# Patient Record
Sex: Male | Born: 1985 | Race: White | Hispanic: No | Marital: Single | Smoking: Never smoker
Health system: Southern US, Community
[De-identification: ages and names within clinical notes are randomized; demographics above are authoritative.]

## PROBLEM LIST (undated history)

## (undated) DIAGNOSIS — K5090 Crohn's disease, unspecified, without complications: Secondary | ICD-10-CM

---
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
## 2007-01-07 ENCOUNTER — Emergency Department: Payer: Self-pay | Admitting: Emergency Medicine

## 2007-02-21 ENCOUNTER — Ambulatory Visit: Payer: Self-pay | Admitting: Gastroenterology

## 2007-09-04 ENCOUNTER — Other Ambulatory Visit: Payer: Self-pay

## 2007-09-04 ENCOUNTER — Emergency Department: Payer: Self-pay | Admitting: Emergency Medicine

## 2008-01-20 ENCOUNTER — Ambulatory Visit: Payer: Self-pay | Admitting: Internal Medicine

## 2008-01-25 ENCOUNTER — Ambulatory Visit: Payer: Self-pay | Admitting: Internal Medicine

## 2008-01-27 ENCOUNTER — Ambulatory Visit: Payer: Self-pay | Admitting: Family Medicine

## 2008-02-22 ENCOUNTER — Ambulatory Visit: Payer: Self-pay | Admitting: Internal Medicine

## 2008-08-15 ENCOUNTER — Emergency Department: Payer: Self-pay | Admitting: Emergency Medicine

## 2008-11-06 ENCOUNTER — Ambulatory Visit: Payer: Self-pay | Admitting: Internal Medicine

## 2009-10-29 ENCOUNTER — Ambulatory Visit: Payer: Self-pay | Admitting: Internal Medicine

## 2010-11-26 ENCOUNTER — Ambulatory Visit: Payer: Self-pay | Admitting: Family Medicine

## 2010-12-01 ENCOUNTER — Ambulatory Visit: Payer: Self-pay | Admitting: Internal Medicine

## 2012-09-19 ENCOUNTER — Emergency Department: Payer: Self-pay | Admitting: Emergency Medicine

## 2012-09-19 LAB — CBC WITH DIFFERENTIAL/PLATELET
Basophil #: 0.1 10*3/uL (ref 0.0–0.1)
HGB: 14.3 g/dL (ref 13.0–18.0)
Lymphocyte #: 1.4 10*3/uL (ref 1.0–3.6)
Lymphocyte %: 7.8 %
MCH: 26.6 pg (ref 26.0–34.0)
MCHC: 32.1 g/dL (ref 32.0–36.0)
Monocyte #: 1.1 x10 3/mm — ABNORMAL HIGH (ref 0.2–1.0)
Monocyte %: 6.1 %
Neutrophil %: 85.3 %
RDW: 12.6 % (ref 11.5–14.5)
WBC: 18.5 10*3/uL — ABNORMAL HIGH (ref 3.8–10.6)

## 2012-09-19 LAB — COMPREHENSIVE METABOLIC PANEL
Albumin: 4.1 g/dL (ref 3.4–5.0)
Alkaline Phosphatase: 66 U/L (ref 50–136)
Anion Gap: 10 (ref 7–16)
BUN: 18 mg/dL (ref 7–18)
Bilirubin,Total: 0.5 mg/dL (ref 0.2–1.0)
Chloride: 106 mmol/L (ref 98–107)
Creatinine: 0.97 mg/dL (ref 0.60–1.30)
EGFR (Non-African Amer.): >60
Osmolality: 284 (ref 275–301)
Potassium: 3.8 mmol/L (ref 3.5–5.1)
SGOT(AST): 187 U/L — ABNORMAL HIGH (ref 15–37)
Sodium: 141 mmol/L (ref 136–145)

## 2013-07-02 ENCOUNTER — Ambulatory Visit: Payer: Self-pay | Admitting: Internal Medicine

## 2015-02-01 ENCOUNTER — Encounter: Payer: Self-pay | Admitting: Emergency Medicine

## 2015-02-01 ENCOUNTER — Ambulatory Visit
Admission: EM | Admit: 2015-02-01 | Discharge: 2015-02-01 | Disposition: A | Discharge status: Home / Self Care | Payer: 59 | Attending: Physician Assistant | Admitting: Physician Assistant

## 2015-02-01 DIAGNOSIS — K5090 Crohn's disease, unspecified, without complications: Secondary | ICD-10-CM | POA: Diagnosis not present

## 2015-02-01 DIAGNOSIS — R252 Cramp and spasm: Secondary | ICD-10-CM | POA: Insufficient documentation

## 2015-02-01 DIAGNOSIS — M79603 Pain in arm, unspecified: Secondary | ICD-10-CM | POA: Diagnosis present

## 2015-02-01 HISTORY — DX: Crohn's disease, unspecified, without complications: K50.90

## 2015-02-01 LAB — CKMB (ARMC ONLY): CK, MB: 15 ng/mL — AB (ref 0.5–5.0)

## 2015-02-01 LAB — COMPREHENSIVE METABOLIC PANEL
ALBUMIN: 4.8 g/dL (ref 3.5–5.0)
ALT: 117 U/L — ABNORMAL HIGH (ref 17–63)
ANION GAP: 10 (ref 5–15)
AST: 95 U/L — ABNORMAL HIGH (ref 15–41)
Alkaline Phosphatase: 57 U/L (ref 38–126)
BILIRUBIN TOTAL: 0.3 mg/dL (ref 0.3–1.2)
BUN: 13 mg/dL (ref 6–20)
CO2: 29 mmol/L (ref 22–32)
CREATININE: 0.97 mg/dL (ref 0.61–1.24)
Calcium: 10.1 mg/dL (ref 8.9–10.3)
Chloride: 100 mmol/L — ABNORMAL LOW (ref 101–111)
Glucose, Bld: 97 mg/dL (ref 65–99)
Potassium: 4.1 mmol/L (ref 3.5–5.1)
SODIUM: 139 mmol/L (ref 135–145)
Total Protein: 7.5 g/dL (ref 6.5–8.1)

## 2015-02-01 LAB — URINALYSIS COMPLETE WITH MICROSCOPIC (ARMC ONLY)
Bilirubin Urine: NEGATIVE
GLUCOSE, UA: NEGATIVE mg/dL
Hgb urine dipstick: NEGATIVE
Ketones, ur: NEGATIVE mg/dL
Leukocytes, UA: NEGATIVE
Nitrite: NEGATIVE
Protein, ur: NEGATIVE mg/dL
RBC / HPF: NONE SEEN RBC/hpf (ref <3)
SPECIFIC GRAVITY, URINE: 1.015 (ref 1.005–1.030)
pH: 7 (ref 5.0–8.0)

## 2015-02-01 LAB — CK: CK TOTAL: 2671 U/L — AB (ref 49–397)

## 2015-02-01 NOTE — Discharge Instructions (Signed)
Please increase your fluids -so you void frequently...  Stop supplements at least temporarily to see your body's response   Reduce work outs to reduce stress on upper body/shoulder girdle  Dark colored urine is a sign of concern! Report to medical facility of your choice....  Please follow up with PCP provider and Orthopedics as our purpose today was to r/o rhabdomyolysis which you presented as your concern   Muscle Cramps and Spasms Muscle cramps and spasms occur when a muscle or muscles tighten and you have no control over this tightening (involuntary muscle contraction). They are a common problem and can develop in any muscle. The most common place is in the calf muscles of the leg. Both muscle cramps and muscle spasms are involuntary muscle contractions, but they also have differences:   Muscle cramps are sporadic and painful. They may last a few seconds to a quarter of an hour. Muscle cramps are often more forceful and last longer than muscle spasms.  Muscle spasms may or may not be painful. They may also last just a few seconds or much longer. CAUSES  It is uncommon for cramps or spasms to be due to a serious underlying problem. In many cases, the cause of cramps or spasms is unknown. Some common causes are:   Overexertion.   Overuse from repetitive motions (doing the same thing over and over).   Remaining in a certain position for a long period of time.   Improper preparation, form, or technique while performing a sport or activity.   Dehydration.   Injury.   Side effects of some medicines.   Abnormally low levels of the salts and ions in your blood (electrolytes), especially potassium and calcium. This could happen if you are taking water pills (diuretics) or you are pregnant.  Some underlying medical problems can make it more likely to develop cramps or spasms. These include, but are not limited to:   Diabetes.   Parkinson disease.   Hormone disorders,  such as thyroid problems.   Alcohol abuse.   Diseases specific to muscles, joints, and bones.   Blood vessel disease where not enough blood is getting to the muscles.  HOME CARE INSTRUCTIONS   Stay well hydrated. Drink enough water and fluids to keep your urine clear or pale yellow.  It may be helpful to massage, stretch, and relax the affected muscle.  For tight or tense muscles, use a warm towel, heating pad, or hot shower water directed to the affected area.  If you are sore or have pain after a cramp or spasm, applying ice to the affected area may relieve discomfort.  Put ice in a plastic bag.  Place a towel between your skin and the bag.  Leave the ice on for 15-20 minutes, 03-04 times a day.  Medicines used to treat a known cause of cramps or spasms may help reduce their frequency or severity. Only take over-the-counter or prescription medicines as directed by your caregiver. SEEK MEDICAL CARE IF:  Your cramps or spasms get more severe, more frequent, or do not improve over time.  MAKE SURE YOU:   Understand these instructions.  Will watch your condition.  Will get help right away if you are not doing well or get worse. Document Released: 01/08/2002 Document Revised: 11/13/2012 Document Reviewed: 07/05/2012 [facility] Patient Information 2015 [place], [place]. This information is not intended to replace advice given to you by your health care provider. Make sure you discuss any questions you have with your health  care provider.

## 2015-02-01 NOTE — ED Provider Notes (Signed)
CSN: [id number]     Arrival date & time 02/01/15  1018 History   None    Chief Complaint  Patient presents with  . Arm Pain   (Consider location/radiation/quality/duration/timing/severity/associated sxs/prior Treatment) HPI 29 yo M presents concerned about bilateral arm pain for the last 2 weeks, usually at night.He works out TEFL teacher ,does weight lifting,running and takes "a bunch" of supplements. Recently restarted creatine supplements. Anxious about arm pain-described as mid upper arm-to -mid forearm at night.. Has concerns about rhabdomyolysis- though denies nausea, malaise, dark urine, tachycardia. Worries about "internal organs " and "liver function" but without specifics. He is very anxious to have labs done. Is in a window of insurance coverage and doesn't totally understand PCP /coordination of care hub concept/restrictions though reviewed multiple times during the visit. Very unwilling to consider reduction in his exercise program despite his concerns about the possible negative effects of overdoing.  Past Medical History  Diagnosis Date  . Crohn's disease    History reviewed. No pertinent past surgical history. Family History  Problem Relation Age of Onset  . Thyroid disease Mother    History  Substance Use Topics  . Smoking status: Never Smoker   . Smokeless tobacco: Never Used  . Alcohol Use: Yes     Comment: occasional    Review of Systems  Constitutional: no fever. Baseline level of activity- running instead of lifting the last few days. Eyes: No visual changes. No red eyes/discharge. ENT:No sore throat or ear pain Cardiovascular:Negative for chest pain/palpitations Respiratory: Negative for shortness of breath Gastrointestinal: No abdominal pain. No nausea,vomiting.No Diarrhea.No constipation. Genitourinary: Negative for dysuria.Normal urination. No dark urine Musculoskeletal: Negative for back pain. FROM extremities without pain Skin: Negative for rash Neurological:  Negative for headache, focal weakness or numbness   Allergies  Review of patient's allergies indicates no known allergies.  Home Medications   Prior to Admission medications   Medication Sig Start Date End Date Taking? Authorizing Provider  mesalamine (LIALDA) 1.2 G EC tablet Take 1.2 g by mouth 2 (two) times daily.   Yes Historical Provider, MD   BP 138/82 mmHg  Pulse 95  Temp(Src) 97.6 F (36.4 C)  Resp 18  Ht  (1.778 m)  Wt 210 lb (95.255 kg)  BMI 30.13 kg/m2  SpO2 100% Physical Exam  Constitutional -alert and oriented,well appearing and in no acute distress; upper body built up Head-atraumatic Eyes- conjunctiva normal, EOMI ,conjugate gaze Easr- canals and TMs negative  Nose- no congestion or rhinorrhea Mouth/throat- mucous membranes moist ,oropharynx non-erythematous Neck- supple without glandular enlargement CV- regular rate, grossly normal heart sounds, good peripheral circulation Resp-no distress, normal respiratory effort,clear to auscultation bilaterally GI- soft,non-tender,no distention GU-  not examined MSK- Bilat  Upper extremity FROM without pain , good grip and cap fill, good pulses,no indication of any restriction of movement or discomfort; LEs bilaterally negative .No lower extremity tenderness nor edema,no joint effusion, ambulatory Neuro- normal speech and language, no gross focal neurological deficit appreciated, no gait instability Skin-warm,dry ,intact; no rash noted-extensive tattooing Psych-mood and affect grossly normal; speech and behavior grossly normal,anxious  ED Course  Procedures (including critical care time) Labs Review Labs Reviewed  URINALYSIS COMPLETEWITH MICROSCOPIC ([facility] ONLY) - Abnormal; Notable for the following:    Color, Urine STRAW (*)    Squamous Epithelial / LPF 0-5 (*)    All other components within normal limits  COMPREHENSIVE METABOLIC PANEL - Abnormal; Notable for the following:    Chloride 100 (*)  AST 95 (*)     ALT 117 (*)    All other components within normal limits  CK - Abnormal; Notable for the following:    Total CK 2671 (*)    All other components within normal limits  CKMB([facility] ONLY) - Abnormal; Notable for the following:    CK, MB 15.0 (*)    All other components within normal limits   Results for orders placed or performed during the hospital encounter of 02/01/15  Urinalysis complete, with microscopic  Result Value Ref Range   Color, Urine STRAW (A) YELLOW   APPearance CLEAR CLEAR   Glucose, UA NEGATIVE NEGATIVE mg/dL   Bilirubin Urine NEGATIVE NEGATIVE   Ketones, ur NEGATIVE NEGATIVE mg/dL   Specific Gravity, Urine 1.015 1.005 - 1.030   Hgb urine dipstick NEGATIVE NEGATIVE   pH 7.0 5.0 - 8.0   Protein, ur NEGATIVE NEGATIVE mg/dL   Nitrite NEGATIVE NEGATIVE   Leukocytes, UA NEGATIVE NEGATIVE   RBC / HPF NONE SEEN <3 RBC/hpf   WBC, UA 0-5 <3 WBC/hpf   Bacteria, UA RARE RARE   Squamous Epithelial / LPF 0-5 (A) RARE  Comprehensive metabolic panel  Result Value Ref Range   Sodium 139 135 - 145 mmol/L   Potassium 4.1 3.5 - 5.1 mmol/L   Chloride 100 (L) 101 - 111 mmol/L   CO2 29 22 - 32 mmol/L   Glucose, Bld 97 65 - 99 mg/dL   BUN 13 6 - 20 mg/dL   Creatinine, Ser 1.61 0.61 - 1.24 mg/dL   Calcium 09.6 8.9 - 04.5 mg/dL   Total Protein 7.5 6.5 - 8.1 g/dL   Albumin 4.8 3.5 - 5.0 g/dL   AST 95 (H) 15 - 41 U/L   ALT 117 (H) 17 - 63 U/L   Alkaline Phosphatase 57 38 - 126 U/L   Total Bilirubin 0.3 0.3 - 1.2 mg/dL   GFR calc non Af Amer >60 >60 mL/min   GFR calc Af Amer >60 >60 mL/min   Anion gap 10 5 - 15  CK  Result Value Ref Range   Total CK 2671 (H) 49 - 397 U/L  CKMB([facility] only)  Result Value Ref Range   CK, MB 15.0 (H) 0.5 - 5.0 ng/mL   Imaging Review No results found.   MDM   1. Muscle cramps    Labs were reviewed with Dr Allena Katz and then with the patient. Negative urine reassurring. General CMP grossly acceptable though indicates stress. The elevated Total  CK and CK.,MB are noteworthy in that there is muscle damage occurring but also reassurring in that the elevation is not reflective of rhabdomyolysis which was his presenting concern. He has been advised to increase his hydration.Decrease his exercise program, which he resists. Establish contact with Primary Care so that he has a base of care and work through them for any specialty consults  [facility] with questions, concerns, exacerbation of symptoms. Strongly advised that dark colored urine is a significant finding and medical care should be obtained directly if noted.  Rae Halsted, PA-C 02/03/15 2119

## 2015-02-01 NOTE — ED Notes (Signed)
Bilateral  arm pain for 1 week. Sore and hard to stretch out. Has been exercising and working out at home.

## 2015-02-03 ENCOUNTER — Encounter: Payer: Self-pay | Admitting: Physician Assistant

## 2015-09-06 ENCOUNTER — Emergency Department
Admission: EM | Admit: 2015-09-06 | Discharge: 2015-09-07 | Disposition: A | Discharge status: Home / Self Care | Payer: BLUE CROSS/BLUE SHIELD | Attending: Emergency Medicine | Admitting: Emergency Medicine

## 2015-09-06 ENCOUNTER — Encounter: Payer: Self-pay | Admitting: *Deleted

## 2015-09-06 DIAGNOSIS — F419 Anxiety disorder, unspecified: Secondary | ICD-10-CM | POA: Insufficient documentation

## 2015-09-06 DIAGNOSIS — R079 Chest pain, unspecified: Secondary | ICD-10-CM | POA: Insufficient documentation

## 2015-09-06 DIAGNOSIS — Z79899 Other long term (current) drug therapy: Secondary | ICD-10-CM | POA: Insufficient documentation

## 2015-09-06 LAB — BASIC METABOLIC PANEL
ANION GAP: UNDETERMINED (ref 5–15)
BUN: 22 mg/dL — ABNORMAL HIGH (ref 6–20)
CALCIUM: UNDETERMINED mg/dL (ref 8.9–10.3)
CO2: UNDETERMINED mmol/L (ref 22–32)
Chloride: UNDETERMINED mmol/L (ref 101–111)
Creatinine, Ser: 1.05 mg/dL (ref 0.61–1.24)
GFR calc Af Amer: >60 mL/min (ref >60)
GFR calc non Af Amer: >60 mL/min (ref >60)
Glucose, Bld: UNDETERMINED mg/dL (ref 65–99)
POTASSIUM: UNDETERMINED mmol/L (ref 3.5–5.1)
Sodium: UNDETERMINED mmol/L (ref 135–145)

## 2015-09-06 LAB — CBC
HCT: 43.3 % (ref 40.0–52.0)
HEMOGLOBIN: 15.2 g/dL (ref 13.0–18.0)
MCH: 28.4 pg (ref 26.0–34.0)
MCHC: 35 g/dL (ref 32.0–36.0)
MCV: 81.1 fL (ref 80.0–100.0)
PLATELETS: 166 10*3/uL (ref 150–440)
RBC: 5.34 MIL/uL (ref 4.40–5.90)
RDW: 13.2 % (ref 11.5–14.5)
WBC: 8.9 10*3/uL (ref 3.8–10.6)

## 2015-09-06 LAB — TROPONIN I: TROPONIN I: 0.05 ng/mL — AB (ref <0.031)

## 2015-09-06 MED ORDER — SODIUM CHLORIDE 0.9 % IV BOLUS (SEPSIS)
1000.0000 mL | Freq: Once | INTRAVENOUS | Status: AC
  Administered 2015-09-06: 1000 mL via INTRAVENOUS

## 2015-09-06 MED ORDER — ASPIRIN 81 MG PO CHEW
324.0000 mg | CHEWABLE_TABLET | Freq: Once | ORAL | Status: AC
  Administered 2015-09-06: 324 mg via ORAL
  Filled 2015-09-06: qty 4
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
## 2015-09-07 ENCOUNTER — Emergency Department: Payer: BLUE CROSS/BLUE SHIELD

## 2015-09-07 LAB — TROPONIN I: TROPONIN I: 0.04 ng/mL — AB (ref <0.031)

## 2015-09-07 NOTE — Discharge Instructions (Signed)
Nonspecific Chest Pain  °Chest pain can be caused by many different conditions. There is always a chance that your pain could be related to something serious, such as a heart attack or a blood clot in your lungs. Chest pain can also be caused by conditions that are not life-threatening. If you have chest pain, it is very important to follow up with your health care provider. °CAUSES  °Chest pain can be caused by: °· Heartburn. °· Pneumonia or bronchitis. °· Anxiety or stress. °· Inflammation around your heart (pericarditis) or lung (pleuritis or pleurisy). °· A blood clot in your lung. °· A collapsed lung (pneumothorax). It can develop suddenly on its own (spontaneous pneumothorax) or from trauma to the chest. °· Shingles infection (varicella-zoster virus). °· Heart attack. °· Damage to the bones, muscles, and cartilage that make up your chest wall. This can include: °¨ Bruised bones due to injury. °¨ Strained muscles or cartilage due to frequent or repeated coughing or overwork. °¨ Fracture to one or more ribs. °¨ Sore cartilage due to inflammation (costochondritis). °RISK FACTORS  °Risk factors for chest pain may include: °· Activities that increase your risk for trauma or injury to your chest. °· Respiratory infections or conditions that cause frequent coughing. °· Medical conditions or overeating that can cause heartburn. °· Heart disease or family history of heart disease. °· Conditions or health behaviors that increase your risk of developing a blood clot. °· Having had chicken pox (varicella zoster). °SIGNS AND SYMPTOMS °Chest pain can feel like: °· Burning or tingling on the surface of your chest or deep in your chest. °· Crushing, pressure, aching, or squeezing pain. °· Dull or sharp pain that is worse when you move, cough, or take a deep breath. °· Pain that is also felt in your back, neck, shoulder, or arm, or pain that spreads to any of these areas. °Your chest pain may come and go, or it may stay  constant. °DIAGNOSIS °Lab tests or other studies may be needed to find the cause of your pain. Your health care provider may have you take a test called an ambulatory ECG (electrocardiogram). An ECG records your heartbeat patterns at the time the test is performed. You may also have other tests, such as: °· Transthoracic echocardiogram (TTE). During echocardiography, sound waves are used to create a picture of all of the heart structures and to look at how blood flows through your heart. °· Transesophageal echocardiogram (TEE). This is a more advanced imaging test that obtains images from inside your body. It allows your health care provider to see your heart in finer detail. °· Cardiac monitoring. This allows your health care provider to monitor your heart rate and rhythm in real time. °· Holter monitor. This is a portable device that records your heartbeat and can help to diagnose abnormal heartbeats. It allows your health care provider to track your heart activity for several days, if needed. °· Stress tests. These can be done through exercise or by taking medicine that makes your heart beat more quickly. °· Blood tests. °· Imaging tests. °TREATMENT  °Your treatment depends on what is causing your chest pain. Treatment may include: °· Medicines. These may include: °¨ Acid blockers for heartburn. °¨ Anti-inflammatory medicine. °¨ Pain medicine for inflammatory conditions. °¨ Antibiotic medicine, if an infection is present. °¨ Medicines to dissolve blood clots. °¨ Medicines to treat coronary artery disease. °· Supportive care for conditions that do not require medicines. This may include: °¨ Resting. °¨ Applying heat   or cold packs to injured areas. °¨ Limiting activities until pain decreases. °HOME CARE INSTRUCTIONS °· If you were prescribed an antibiotic medicine, finish it all even if you start to feel better. °· Avoid any activities that bring on chest pain. °· Do not use any tobacco products, including  cigarettes, chewing tobacco, or electronic cigarettes. If you need help quitting, ask your health care provider. °· Do not drink alcohol. °· Take medicines only as directed by your health care provider. °· Keep all follow-up visits as directed by your health care provider. This is important. This includes any further testing if your chest pain does not go away. °· If heartburn is the cause for your chest pain, you may be told to keep your head raised (elevated) while sleeping. This reduces the chance that acid will go from your stomach into your esophagus. °· Make lifestyle changes as directed by your health care provider. These may include: °¨ Getting regular exercise. Ask your health care provider to suggest some activities that are safe for you. °¨ Eating a heart-healthy diet. A registered dietitian can help you to learn healthy eating options. °¨ Maintaining a healthy weight. °¨ Managing diabetes, if necessary. °¨ Reducing stress. °SEEK MEDICAL CARE IF: °· Your chest pain does not go away after treatment. °· You have a rash with blisters on your chest. °· You have a fever. °SEEK IMMEDIATE MEDICAL CARE IF:  °· Your chest pain is worse. °· You have an increasing cough, or you cough up blood. °· You have severe abdominal pain. °· You have severe weakness. °· You faint. °· You have chills. °· You have sudden, unexplained chest discomfort. °· You have sudden, unexplained discomfort in your arms, back, neck, or jaw. °· You have shortness of breath at any time. °· You suddenly start to sweat, or your skin gets clammy. °· You feel nauseous or you vomit. °· You suddenly feel light-headed or dizzy. °· Your heart begins to beat quickly, or it feels like it is skipping beats. °These symptoms may represent a serious problem that is an emergency. Do not wait to see if the symptoms will go away. Get medical help right away. Call your local emergency services (911 in the [place] Do not drive yourself to the hospital. °  °This  information is not intended to replace advice given to you by your health care provider. Make sure you discuss any questions you have with your health care provider. °  °Document Released: 04/28/2005 Document Revised: 08/09/2014 Document Reviewed: 02/22/2014 °Elsevier Interactive Patient Education ©2016 [employer] ° °
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
## 2017-08-12 DIAGNOSIS — M542 Cervicalgia: Secondary | ICD-10-CM | POA: Diagnosis not present

## 2017-08-12 DIAGNOSIS — M9901 Segmental and somatic dysfunction of cervical region: Secondary | ICD-10-CM | POA: Diagnosis not present

## 2017-08-12 DIAGNOSIS — M791 Myalgia, unspecified site: Secondary | ICD-10-CM | POA: Diagnosis not present

## 2017-08-19 DIAGNOSIS — M542 Cervicalgia: Secondary | ICD-10-CM | POA: Diagnosis not present

## 2017-08-19 DIAGNOSIS — M9901 Segmental and somatic dysfunction of cervical region: Secondary | ICD-10-CM | POA: Diagnosis not present

## 2017-08-19 DIAGNOSIS — M791 Myalgia, unspecified site: Secondary | ICD-10-CM | POA: Diagnosis not present

## 2017-09-20 DIAGNOSIS — I10 Essential (primary) hypertension: Secondary | ICD-10-CM | POA: Diagnosis not present

## 2017-10-18 DIAGNOSIS — I10 Essential (primary) hypertension: Secondary | ICD-10-CM | POA: Diagnosis not present

## 2018-03-09 DIAGNOSIS — K523 Indeterminate colitis: Secondary | ICD-10-CM | POA: Diagnosis not present

## 2018-03-23 DIAGNOSIS — Z0000 Encounter for general adult medical examination without abnormal findings: Secondary | ICD-10-CM | POA: Diagnosis not present

## 2018-03-23 DIAGNOSIS — K5010 Crohn's disease of large intestine without complications: Secondary | ICD-10-CM | POA: Diagnosis not present

## 2018-03-23 DIAGNOSIS — R509 Fever, unspecified: Secondary | ICD-10-CM | POA: Diagnosis not present

## 2018-03-23 DIAGNOSIS — R51 Headache: Secondary | ICD-10-CM | POA: Diagnosis not present

## 2018-04-13 DIAGNOSIS — K5010 Crohn's disease of large intestine without complications: Secondary | ICD-10-CM | POA: Diagnosis not present

## 2019-07-04 ENCOUNTER — Other Ambulatory Visit: Payer: Self-pay

## 2019-07-04 DIAGNOSIS — Z20822 Contact with and (suspected) exposure to covid-19: Secondary | ICD-10-CM

## 2019-07-07 LAB — NOVEL CORONAVIRUS, NAA: SARS-CoV-2, NAA: NOT DETECTED

## 2019-07-26 ENCOUNTER — Ambulatory Visit: Payer: Managed Care, Other (non HMO) | Attending: Internal Medicine

## 2019-07-26 DIAGNOSIS — Z20822 Contact with and (suspected) exposure to covid-19: Secondary | ICD-10-CM

## 2019-07-27 LAB — NOVEL CORONAVIRUS, NAA: SARS-CoV-2, NAA: DETECTED — AB

## 2021-11-22 ENCOUNTER — Other Ambulatory Visit: Payer: Self-pay

## 2021-11-22 ENCOUNTER — Emergency Department: Payer: Managed Care, Other (non HMO)

## 2021-11-22 DIAGNOSIS — S80811A Abrasion, right lower leg, initial encounter: Secondary | ICD-10-CM | POA: Insufficient documentation

## 2021-11-22 DIAGNOSIS — Z23 Encounter for immunization: Secondary | ICD-10-CM | POA: Diagnosis not present

## 2021-11-22 DIAGNOSIS — R0789 Other chest pain: Secondary | ICD-10-CM | POA: Insufficient documentation

## 2021-11-22 DIAGNOSIS — S80812A Abrasion, left lower leg, initial encounter: Secondary | ICD-10-CM | POA: Insufficient documentation

## 2021-11-22 DIAGNOSIS — S0512XA Contusion of eyeball and orbital tissues, left eye, initial encounter: Secondary | ICD-10-CM | POA: Insufficient documentation

## 2021-11-22 DIAGNOSIS — S0083XA Contusion of other part of head, initial encounter: Secondary | ICD-10-CM | POA: Insufficient documentation

## 2021-11-22 DIAGNOSIS — S0990XA Unspecified injury of head, initial encounter: Secondary | ICD-10-CM | POA: Insufficient documentation
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
## 2021-11-23 ENCOUNTER — Emergency Department
Admission: EM | Admit: 2021-11-23 | Discharge: 2021-11-23 | Disposition: A | Discharge status: Home / Self Care | Payer: Managed Care, Other (non HMO) | Attending: Emergency Medicine | Admitting: Emergency Medicine

## 2021-11-23 DIAGNOSIS — S0083XA Contusion of other part of head, initial encounter: Secondary | ICD-10-CM

## 2021-11-23 DIAGNOSIS — T148XXA Other injury of unspecified body region, initial encounter: Secondary | ICD-10-CM

## 2021-11-23 DIAGNOSIS — S0990XA Unspecified injury of head, initial encounter: Secondary | ICD-10-CM

## 2021-11-23 DIAGNOSIS — R0789 Other chest pain: Secondary | ICD-10-CM

## 2021-11-23 MED ORDER — HYDROCODONE-ACETAMINOPHEN 5-325 MG PO TABS: 2.0000 | ORAL_TABLET | Freq: Four times a day (QID) | ORAL | 0 refills | Status: AC | PRN

## 2021-11-23 MED ORDER — IBUPROFEN 800 MG PO TABS
800.0000 mg | ORAL_TABLET | Freq: Once | ORAL | Status: AC
Start: 2021-11-23 — End: 2021-11-23
  Administered 2021-11-23: 800 mg via ORAL
  Filled 2021-11-23: qty 1

## 2021-11-23 MED ORDER — ONDANSETRON 4 MG PO TBDP: 4.0000 mg | ORAL_TABLET | Freq: Four times a day (QID) | ORAL | 0 refills | Status: AC | PRN

## 2021-11-23 MED ORDER — BACITRACIN ZINC 500 UNIT/GM EX OINT
TOPICAL_OINTMENT | Freq: Once | CUTANEOUS | Status: AC
  Administered 2021-11-23: 1 via TOPICAL
  Filled 2021-11-23: qty 0.9

## 2021-11-23 MED ORDER — TETANUS-DIPHTH-ACELL PERTUSSIS 5-2.5-18.5 LF-MCG/0.5 IM SUSY
0.5000 mL | PREFILLED_SYRINGE | Freq: Once | INTRAMUSCULAR | Status: AC
  Administered 2021-11-23: 0.5 mL via INTRAMUSCULAR
  Filled 2021-11-23: qty 0.5

## 2021-11-23 NOTE — ED Notes (Signed)
0038 This RN cleaned pt abrasions on bilateral legs and rt great toe with NS and gauze, applied bacitracin as ordered.  Pt refused dressings stating that he was going home to shower. ?
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
